# Patient Record
Sex: Female | Born: 1950 | Race: White | Hispanic: No | Marital: Married | Smoking: Former smoker
Health system: Southern US, Community
[De-identification: ages and names within clinical notes are randomized; demographics above are authoritative.]

## PROBLEM LIST (undated history)

## (undated) DIAGNOSIS — E079 Disorder of thyroid, unspecified: Secondary | ICD-10-CM

## (undated) DIAGNOSIS — C801 Malignant (primary) neoplasm, unspecified: Secondary | ICD-10-CM

## (undated) DIAGNOSIS — J449 Chronic obstructive pulmonary disease, unspecified: Secondary | ICD-10-CM

## (undated) DIAGNOSIS — R569 Unspecified convulsions: Secondary | ICD-10-CM

## (undated) DIAGNOSIS — M359 Systemic involvement of connective tissue, unspecified: Secondary | ICD-10-CM

## (undated) HISTORY — PX: APPENDECTOMY: SHX54

---

## 2016-02-13 ENCOUNTER — Emergency Department (HOSPITAL_BASED_OUTPATIENT_CLINIC_OR_DEPARTMENT_OTHER): Payer: BLUE CROSS/BLUE SHIELD

## 2016-02-13 ENCOUNTER — Encounter (HOSPITAL_BASED_OUTPATIENT_CLINIC_OR_DEPARTMENT_OTHER): Payer: Self-pay | Admitting: Emergency Medicine

## 2016-02-13 ENCOUNTER — Emergency Department (HOSPITAL_BASED_OUTPATIENT_CLINIC_OR_DEPARTMENT_OTHER)
Admission: EM | Admit: 2016-02-13 | Discharge: 2016-02-14 | Disposition: A | Discharge status: Home / Self Care | Payer: BLUE CROSS/BLUE SHIELD | Attending: Emergency Medicine | Admitting: Emergency Medicine

## 2016-02-13 DIAGNOSIS — R4182 Altered mental status, unspecified: Secondary | ICD-10-CM | POA: Insufficient documentation

## 2016-02-13 DIAGNOSIS — R531 Weakness: Secondary | ICD-10-CM | POA: Insufficient documentation

## 2016-02-13 DIAGNOSIS — Z87891 Personal history of nicotine dependence: Secondary | ICD-10-CM | POA: Diagnosis not present

## 2016-02-13 DIAGNOSIS — Z79899 Other long term (current) drug therapy: Secondary | ICD-10-CM | POA: Diagnosis not present

## 2016-02-13 DIAGNOSIS — J449 Chronic obstructive pulmonary disease, unspecified: Secondary | ICD-10-CM | POA: Insufficient documentation

## 2016-02-13 DIAGNOSIS — Z87898 Personal history of other specified conditions: Secondary | ICD-10-CM

## 2016-02-13 HISTORY — DX: Unspecified convulsions: R56.9

## 2016-02-13 HISTORY — DX: Malignant (primary) neoplasm, unspecified: C80.1

## 2016-02-13 HISTORY — DX: Chronic obstructive pulmonary disease, unspecified: J44.9

## 2016-02-13 HISTORY — DX: Disorder of thyroid, unspecified: E07.9

## 2016-02-13 HISTORY — DX: Systemic involvement of connective tissue, unspecified: M35.9

## 2016-02-13 LAB — CBC WITH DIFFERENTIAL/PLATELET
Basophils Absolute: 0 10*3/uL (ref 0.0–0.1)
Basophils Relative: 0 %
Eosinophils Absolute: 0.2 10*3/uL (ref 0.0–0.7)
Eosinophils Relative: 2 %
HEMATOCRIT: 40.7 % (ref 36.0–46.0)
Hemoglobin: 13.2 g/dL (ref 12.0–15.0)
LYMPHS ABS: 1.5 10*3/uL (ref 0.7–4.0)
LYMPHS PCT: 20 %
MCH: 28.1 pg (ref 26.0–34.0)
MCHC: 32.4 g/dL (ref 30.0–36.0)
MCV: 86.8 fL (ref 78.0–100.0)
MONO ABS: 0.6 10*3/uL (ref 0.1–1.0)
MONOS PCT: 8 %
NEUTROS ABS: 5.4 10*3/uL (ref 1.7–7.7)
Neutrophils Relative %: 70 %
Platelets: 280 10*3/uL (ref 150–400)
RBC: 4.69 MIL/uL (ref 3.87–5.11)
RDW: 14.1 % (ref 11.5–15.5)
WBC: 7.8 10*3/uL (ref 4.0–10.5)

## 2016-02-13 LAB — URINALYSIS, ROUTINE W REFLEX MICROSCOPIC
Bilirubin Urine: NEGATIVE
Glucose, UA: NEGATIVE mg/dL
Hgb urine dipstick: NEGATIVE
Ketones, ur: NEGATIVE mg/dL
Nitrite: NEGATIVE
Protein, ur: NEGATIVE mg/dL
Specific Gravity, Urine: 1.017 (ref 1.005–1.030)
pH: 7.5 (ref 5.0–8.0)

## 2016-02-13 LAB — COMPREHENSIVE METABOLIC PANEL
ALT: 19 U/L (ref 14–54)
ANION GAP: 6 (ref 5–15)
AST: 20 U/L (ref 15–41)
Albumin: 4 g/dL (ref 3.5–5.0)
Alkaline Phosphatase: 116 U/L (ref 38–126)
BILIRUBIN TOTAL: 0.3 mg/dL (ref 0.3–1.2)
BUN: 14 mg/dL (ref 6–20)
CALCIUM: 9.4 mg/dL (ref 8.9–10.3)
CO2: 28 mmol/L (ref 22–32)
Chloride: 107 mmol/L (ref 101–111)
Creatinine, Ser: 0.59 mg/dL (ref 0.44–1.00)
GFR calc Af Amer: >60 mL/min (ref >60)
Glucose, Bld: 97 mg/dL (ref 65–99)
POTASSIUM: 4.1 mmol/L (ref 3.5–5.1)
Sodium: 141 mmol/L (ref 135–145)
TOTAL PROTEIN: 6.8 g/dL (ref 6.5–8.1)

## 2016-02-13 LAB — URINE MICROSCOPIC-ADD ON: RBC / HPF: NONE SEEN RBC/hpf (ref 0–5)

## 2016-02-13 LAB — TROPONIN I: Troponin I: <0.03 ng/mL (ref <0.03)

## 2016-02-13 LAB — I-STAT CG4 LACTIC ACID, ED: Lactic Acid, Venous: 1.88 mmol/L (ref 0.5–1.9)

## 2016-02-13 LAB — CBG MONITORING, ED: Glucose-Capillary: 89 mg/dL (ref 65–99)

## 2016-02-13 MED ORDER — HYDROCODONE-ACETAMINOPHEN 5-325 MG PO TABS: 1.0000 | ORAL_TABLET | ORAL | Status: AC | PRN

## 2016-02-13 NOTE — ED Notes (Signed)
Patient states that she doesn't feel good reports " I am weak all over and my brain feels weird" - Patient is looking very tired, and is very sluggish with her speech. Patient has equal bilateral weakness. Patient at times takes deep breaths with an almost snoring quality to them. Per husband the patent is unable to walk and she is just stumbling. This started about 0200 - symptoms seems to resolved this am then took a nap starting at 2 pm and then woke up at 5 pm with this weakness. Husband reports that she has had these spells before but they are unsure what they are

## 2016-02-13 NOTE — ED Provider Notes (Signed)
CSN: [id number]     Arrival date & time 02/13/16  1726 History  By signing my name below, I, Julie Welch, attest that this documentation has been prepared under the direction and in the presence of Julie Quale, MD. Electronically Signed: [name], ED Scribe. 02/13/2016. 6:06 PM.    Chief Complaint  Patient presents with  . Weakness  . Altered Mental Status   The history is provided by the patient and the spouse. No language interpreter was used.   HPI Comments: Julie Welch is a 65 y.o. female who presents to the Emergency Department complaining of generalized weakness that began 1 night prior. Pt states that last night she got up to use the restroom and was not able to walk, she states that her feet were moving but she was not going any where. She states that her symptoms resolved from last night but took a nap at 2 PM and when she woke up around 5 PM her symptoms returned. Husband states that she has associated slurred speech. Pt states "my brain feels weird." Pt denies numbness.  Reports similar symptoms 1 year prior. Diagnosed with possible seizure.  On chart review thought to possibly have been a seizure disorder vs conversion episode.  Symptoms resolved and improved on their own.  Patient has been seen by neurology at [facility] as well as locally in her home town.  Patient is not on an antiepileptics.  Patient reports there was lots of stress in her life around the time of the last episode.    Past Medical History:  Diagnosis Date  . Autoimmune disease ([place])   . Cancer ([place])   . COPD (chronic obstructive pulmonary disease) ([place])   . Seizures ([place])    questionable   . Thyroid disease    Past Surgical History:  Procedure Laterality Date  . APPENDECTOMY     History reviewed. No pertinent family history. Social History  Substance Use Topics  . Smoking status: Former Research scientist (life sciences)  . Smokeless tobacco: Not on file  . Alcohol use No   OB History    No data available      Review of Systems  Constitutional: Negative for fever.  HENT: Negative for voice change.   Eyes: Negative for visual disturbance.  Respiratory: Negative for choking, shortness of breath and wheezing.   Cardiovascular: Negative for chest pain and palpitations.  Gastrointestinal: Negative for abdominal pain, diarrhea, nausea and vomiting.  Genitourinary: Negative for dysuria and urgency.  Musculoskeletal: Negative for back pain and myalgias.  Skin: Negative for rash.  Neurological: Positive for speech difficulty and weakness. Negative for numbness.  Hematological: Does not bruise/bleed easily.      Allergies  Levaquin [levofloxacin in d5w]  Home Medications   Prior to Admission medications   Medication Sig Start Date End Date Taking? Authorizing Provider  buPROPion (WELLBUTRIN) 75 MG tablet Take 75 mg by mouth 2 (two) times daily.   Yes Historical Provider, MD  levothyroxine (SYNTHROID, LEVOTHROID) 75 MCG tablet Take 75 mcg by mouth daily before breakfast.   Yes Historical Provider, MD  HYDROcodone-acetaminophen (NORCO/VICODIN) 5-325 MG tablet Take 1 tablet by mouth every 4 (four) hours as needed for moderate pain or severe pain. 02/13/16   Julie Quale, MD   BP 128/74 (BP Location: Right Arm)   Pulse 65   Temp 98.7 F (37.1 C) (Oral)   Resp 19   Ht 5\' 5"  (1.651 m)   Wt 160 lb (72.6 kg)   SpO2 100%  BMI 26.63 kg/m    Physical Exam  Constitutional: She is oriented to person, place, and time. She appears well-developed and well-nourished. No distress.  HENT:  Head: Normocephalic and atraumatic.  Right Ear: External ear normal.  Left Ear: External ear normal.  Nose: Nose normal.  Mouth/Throat: Oropharynx is clear and moist. No oropharyngeal exudate.  Eyes: Conjunctivae and EOM are normal. Pupils are equal, round, and reactive to light.  Neck: Normal range of motion. Neck supple. No tracheal deviation present.  Cardiovascular: Normal rate, regular rhythm, normal  heart sounds and intact distal pulses.   No murmur heard. Pulmonary/Chest: Effort normal and breath sounds normal. No respiratory distress. She has no wheezes. She has no rales.  Abdominal: Soft. She exhibits no distension. There is no tenderness.  Musculoskeletal: Normal range of motion. She exhibits no edema or tenderness.  Neurological: She is alert and oriented to person, place, and time. She has normal strength. No cranial nerve deficit or sensory deficit. She exhibits normal muscle tone.  Reports generalized weakness although appears to have normal strength in all extremities.  Patient able to hold arms up but both shake.  Normal speech.  Normal coordination with normal finger to nose examination.  Skin: Skin is warm and dry. No rash noted. She is not diaphoretic.  Psychiatric: She has a normal mood and affect. Her behavior is normal.  Nursing note and vitals reviewed.   ED Course  Procedures (including critical care time) DIAGNOSTIC STUDIES: Oxygen Saturation is 95% on RA, normal by my interpretation.    COORDINATION OF CARE: 6:21 PM-Discussed treatment plan which includes EKG with pt at bedside and pt agreed to plan.     Labs Review Labs Reviewed  URINALYSIS, ROUTINE W REFLEX MICROSCOPIC (NOT AT [facility]) - Abnormal; Notable for the following:       Result Value   APPearance CLOUDY (*)    Leukocytes, UA SMALL (*)    All other components within normal limits  URINE MICROSCOPIC-ADD ON - Abnormal; Notable for the following:    Squamous Epithelial / LPF 0-5 (*)    Bacteria, UA FEW (*)    All other components within normal limits  CBC WITH DIFFERENTIAL/PLATELET  COMPREHENSIVE METABOLIC PANEL  TROPONIN I  CBG MONITORING, ED  I-STAT CG4 LACTIC ACID, ED    Imaging Review No results found.    EKG Interpretation  Date/Time:  Saturday February 13 2016 18:04:06 EDT Ventricular Rate:  80 PR Interval:    QRS Duration: 110 QT Interval:  399 QTC Calculation: 461 R Axis:   -7 Text  Interpretation:  Sinus rhythm Low voltage, precordial leads Abnormal R-wave progression, early transition Borderline T abnormalities, anterior leads No previous ECGs available Confirmed by Kenton Fortin ([id number]) on 02/13/2016 7:29:24 PM Also confirmed by Lonia Skinner ([id number]), editor [place] CT, Leda Gauze [phone])  on 02/14/2016 9:22:47 AM       MDM  Patient was seen and evaluated in stable condition.  Patient without distributive neuro process with generalized complaint of weakness although denies falling when she had initial episode of not being able to walk.  Symptoms resolved on their own without intervention.  PAtient has already had a similar episode one year ago with extensive work up and second outpatient appointment from neurology at [facility].  Imaging and laboratory studies here unremarkable.  Neurology was consulted to discuss case but were tied up with code stroke and while awaiting call back patient and husband anxious for discharge and did not want to wait for call  back from neurology.  Patient was discharged home in stable condition with strict return precautions and instruction to follow up outpatient. Final diagnoses:  Generalized weakness  History of seizure      I personally performed the services described in this documentation, which was scribed in my presence. The recorded information has been reviewed and is accurate.     Julie Quale, MD 02/19/16 1025

## 2016-02-13 NOTE — ED Notes (Signed)
Pt stated she wanted to see if she could walk after providing a urine sample. EMT helped pt walk down the hall and back. Pt stated she did not feel SOB, or nauseous. Pt stated she did feel "a bit dizzy." Pt did well walking otherwise.

## 2016-02-14 NOTE — Discharge Instructions (Signed)
You were seen and evaluated today for your generalized weakness and inability to speak. The exact cause is not completely clear but this may be related to a neurologic issue such as seizures like you're evaluated for previously. Please make arrangements to follow-up with your primary care physician as soon as possible as well as to follow-up with your neurologist in your hometown as soon as possible. You can also arrange follow-up at [facility] at the number that was provided up above. Seek immediate medical attention if you suddenly developed weakness on one side, difficulty with speech, headache. Use assistive devices to walk as needed.

## 2017-01-12 IMAGING — CT CT HEAD W/O CM
3 series · 16 of 47 positions shown, 19 images · non-contrast
Comparison: None.

CLINICAL DATA: Patient with weakness.  Fatigue.

EXAM:
CT HEAD WITHOUT CONTRAST
TECHNIQUE: Contiguous axial images were obtained from the base of the skull
through the vertex without intravenous contrast.

[Series 2: head wo · axial · 0.42mm/px · z∈[-186,-46]mm · 10 of 34 slices shown, 13 images]
[im 3/34  brain]
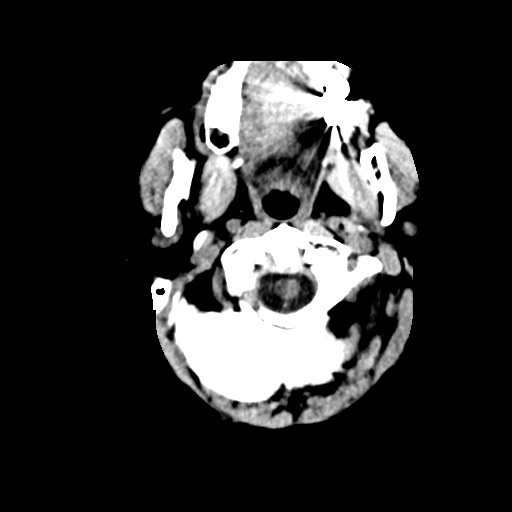
[im 3/34  bone]
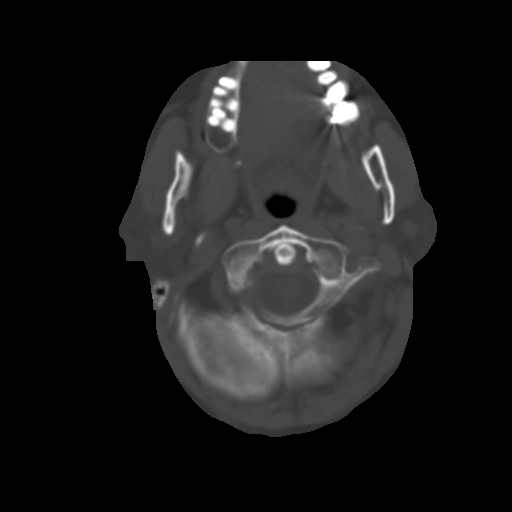
[im 6/34  brain]
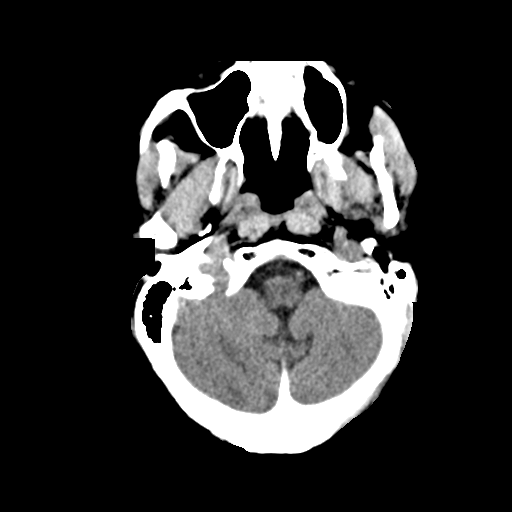
[im 10/34  brain]
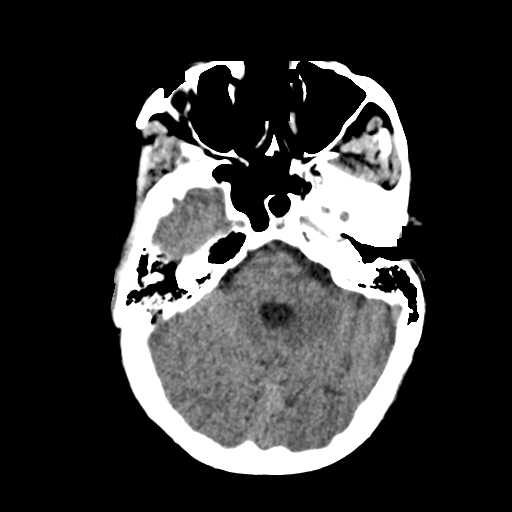
[im 12/34  brain]
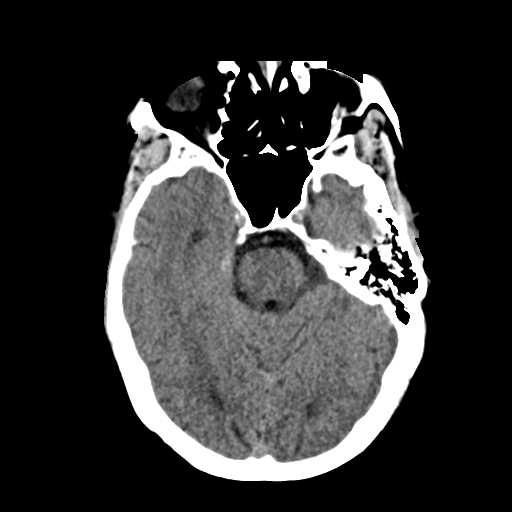
[im 15/34  brain]
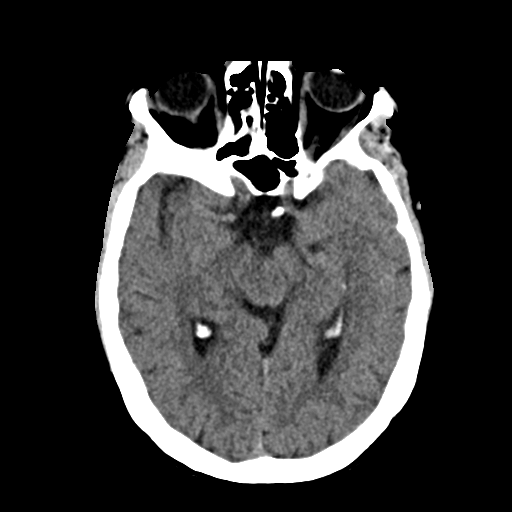
[im 15/34  bone]
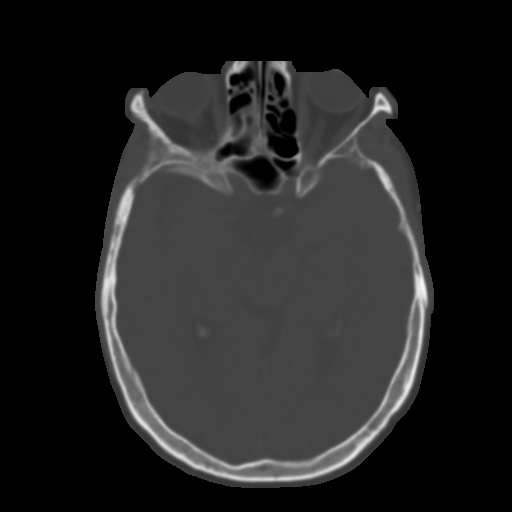
[im 19/34  brain]
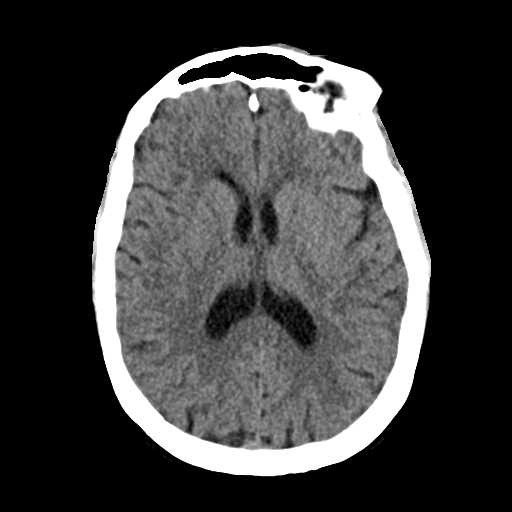
[im 22/34  brain]
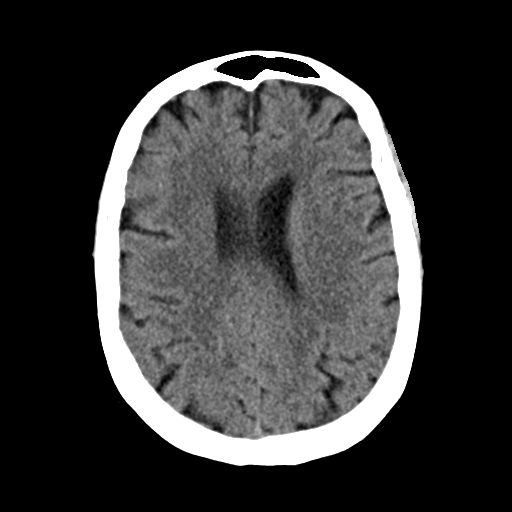
[im 26/34  brain]
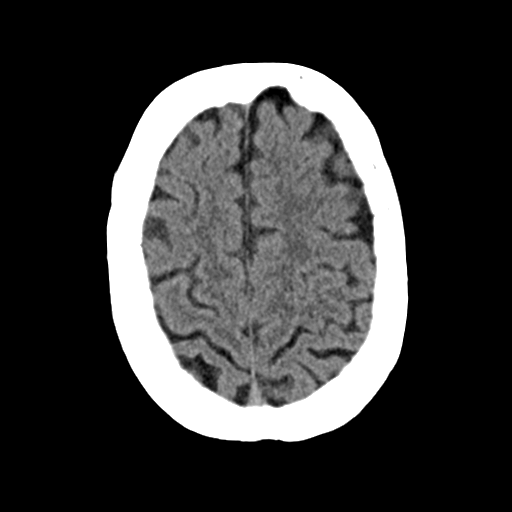
[im 28/34  brain]
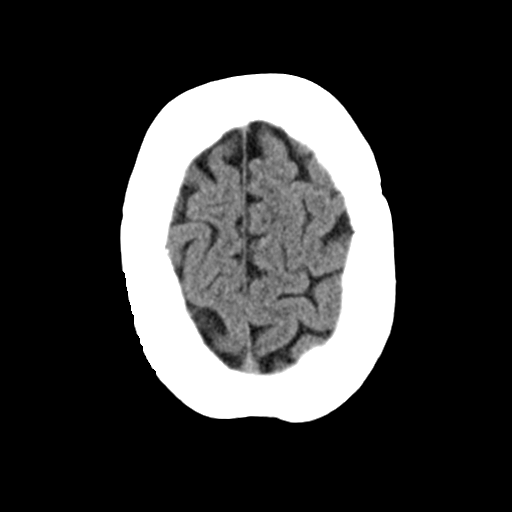
[im 28/34  bone]
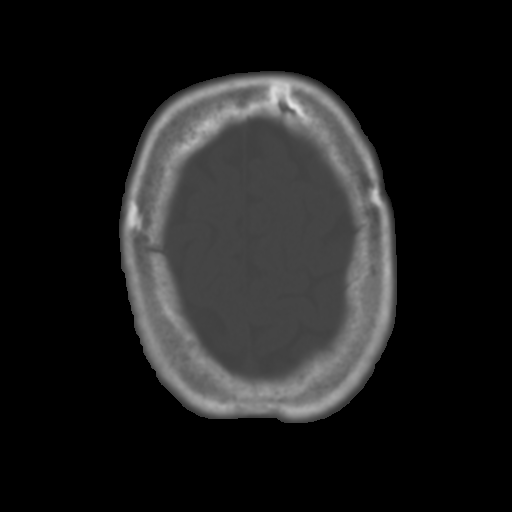
[im 31/34  brain]
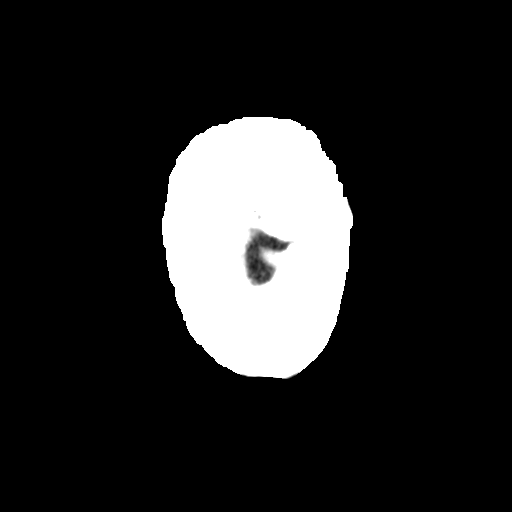

[Series 4: coronal soft · coronal · 0.41mm/px · 3 of 66 slices shown]
[im 22/66  brain]
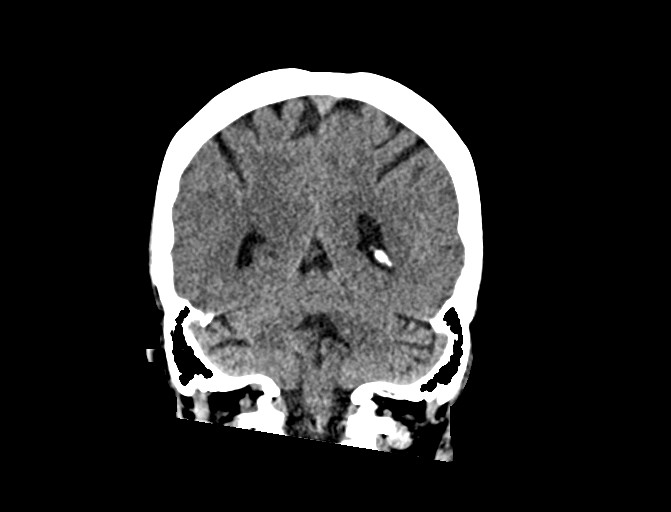
[im 29/66  brain]
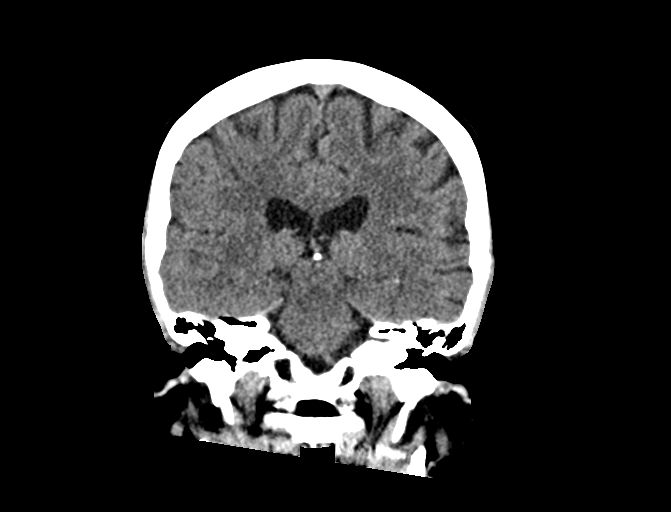
[im 37/66  brain]
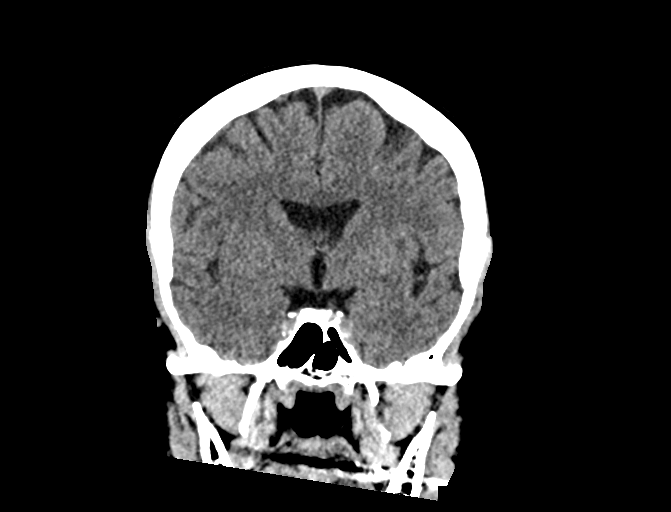

[Series 5: sag soft · sagittal · 0.41mm/px · 3 of 51 slices shown]
[im 18/51  brain]
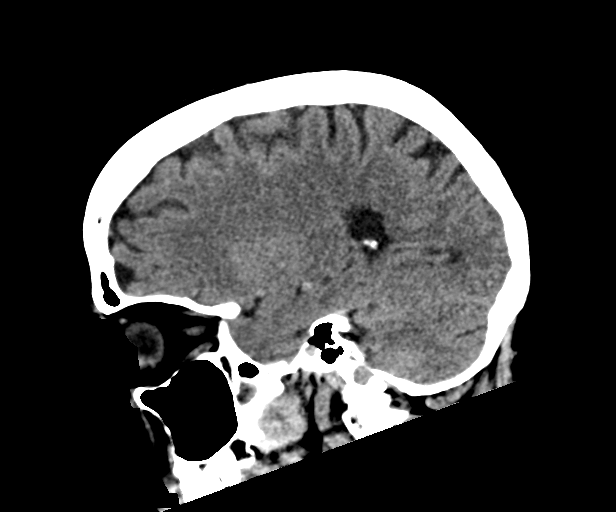
[im 26/51  brain]
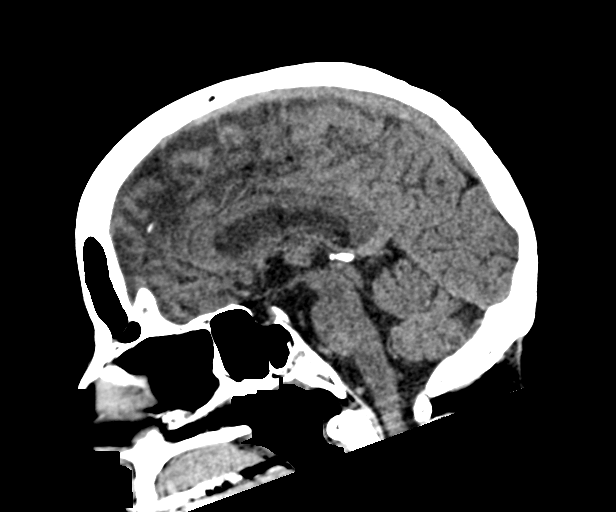
[im 33/51  brain]
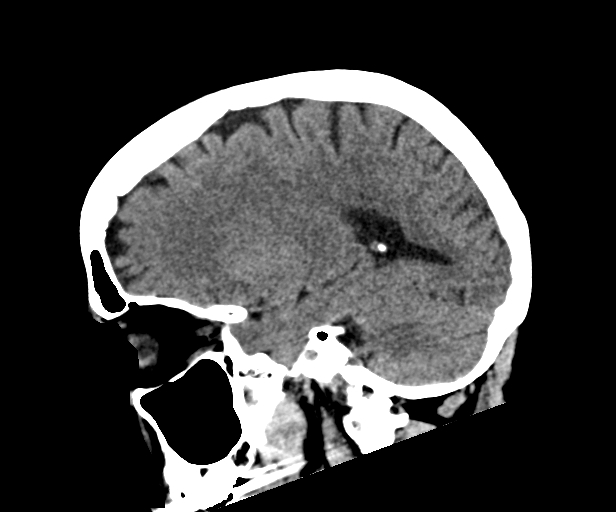

[16 of 47 positions shown; findings below may reference images not displayed]

FINDINGS: Ventricles and sulci are appropriate for patient's age. No evidence
for acute cortically based infarct, intracranial hemorrhage, mass
lesion or mass-effect. Orbits are unremarkable. Paranasal sinuses
are well aerated. Mastoid air cells are unremarkable. Calvarium is
intact.
IMPRESSION: No acute intracranial process.

## 2017-01-12 IMAGING — DX DG CHEST 2V
2 series · 2 of 2 positions shown · non-contrast
Comparison: None.

CLINICAL DATA: Patient with weakness and fatigue.

EXAM:
CHEST  2 VIEW

[chest lat]
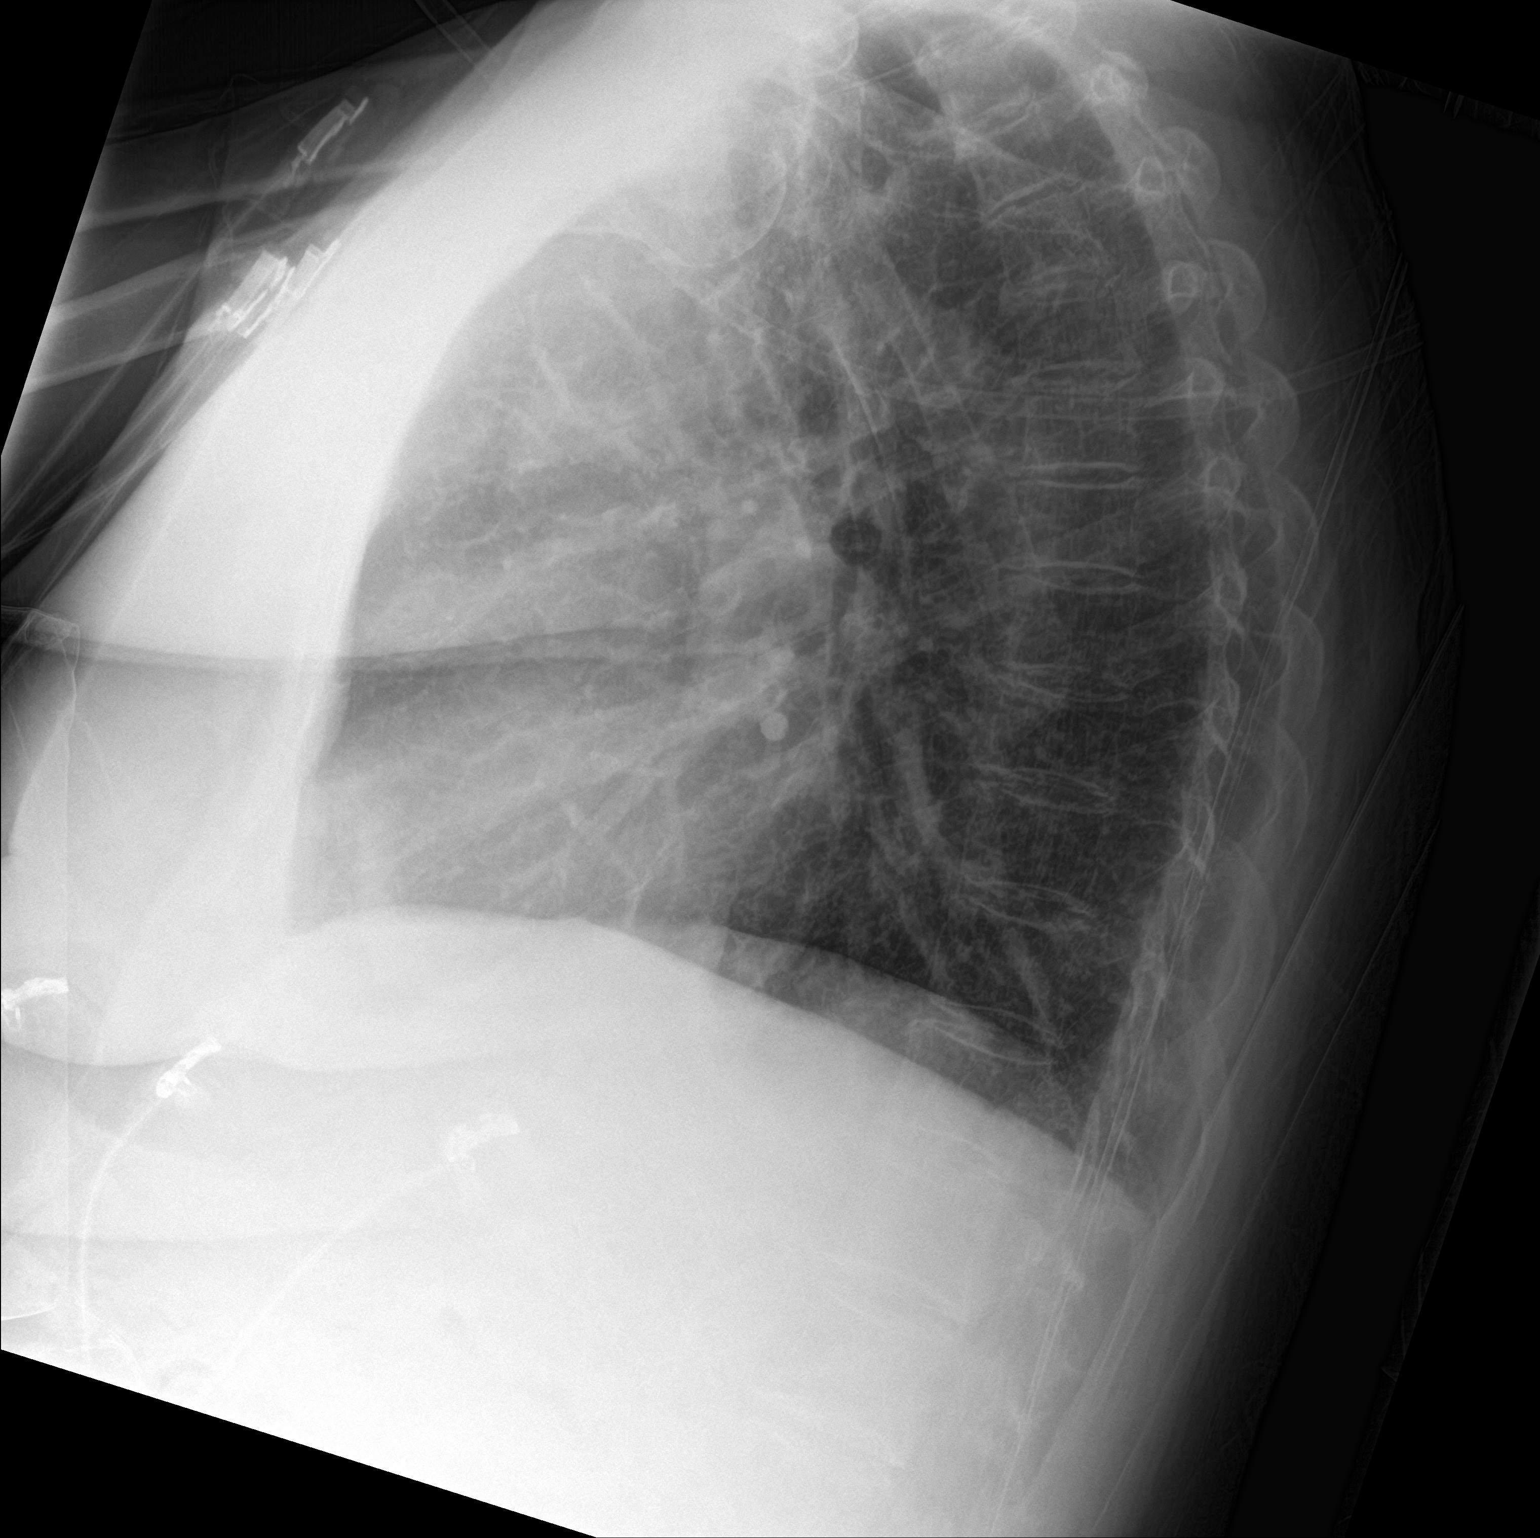

[chest ap]
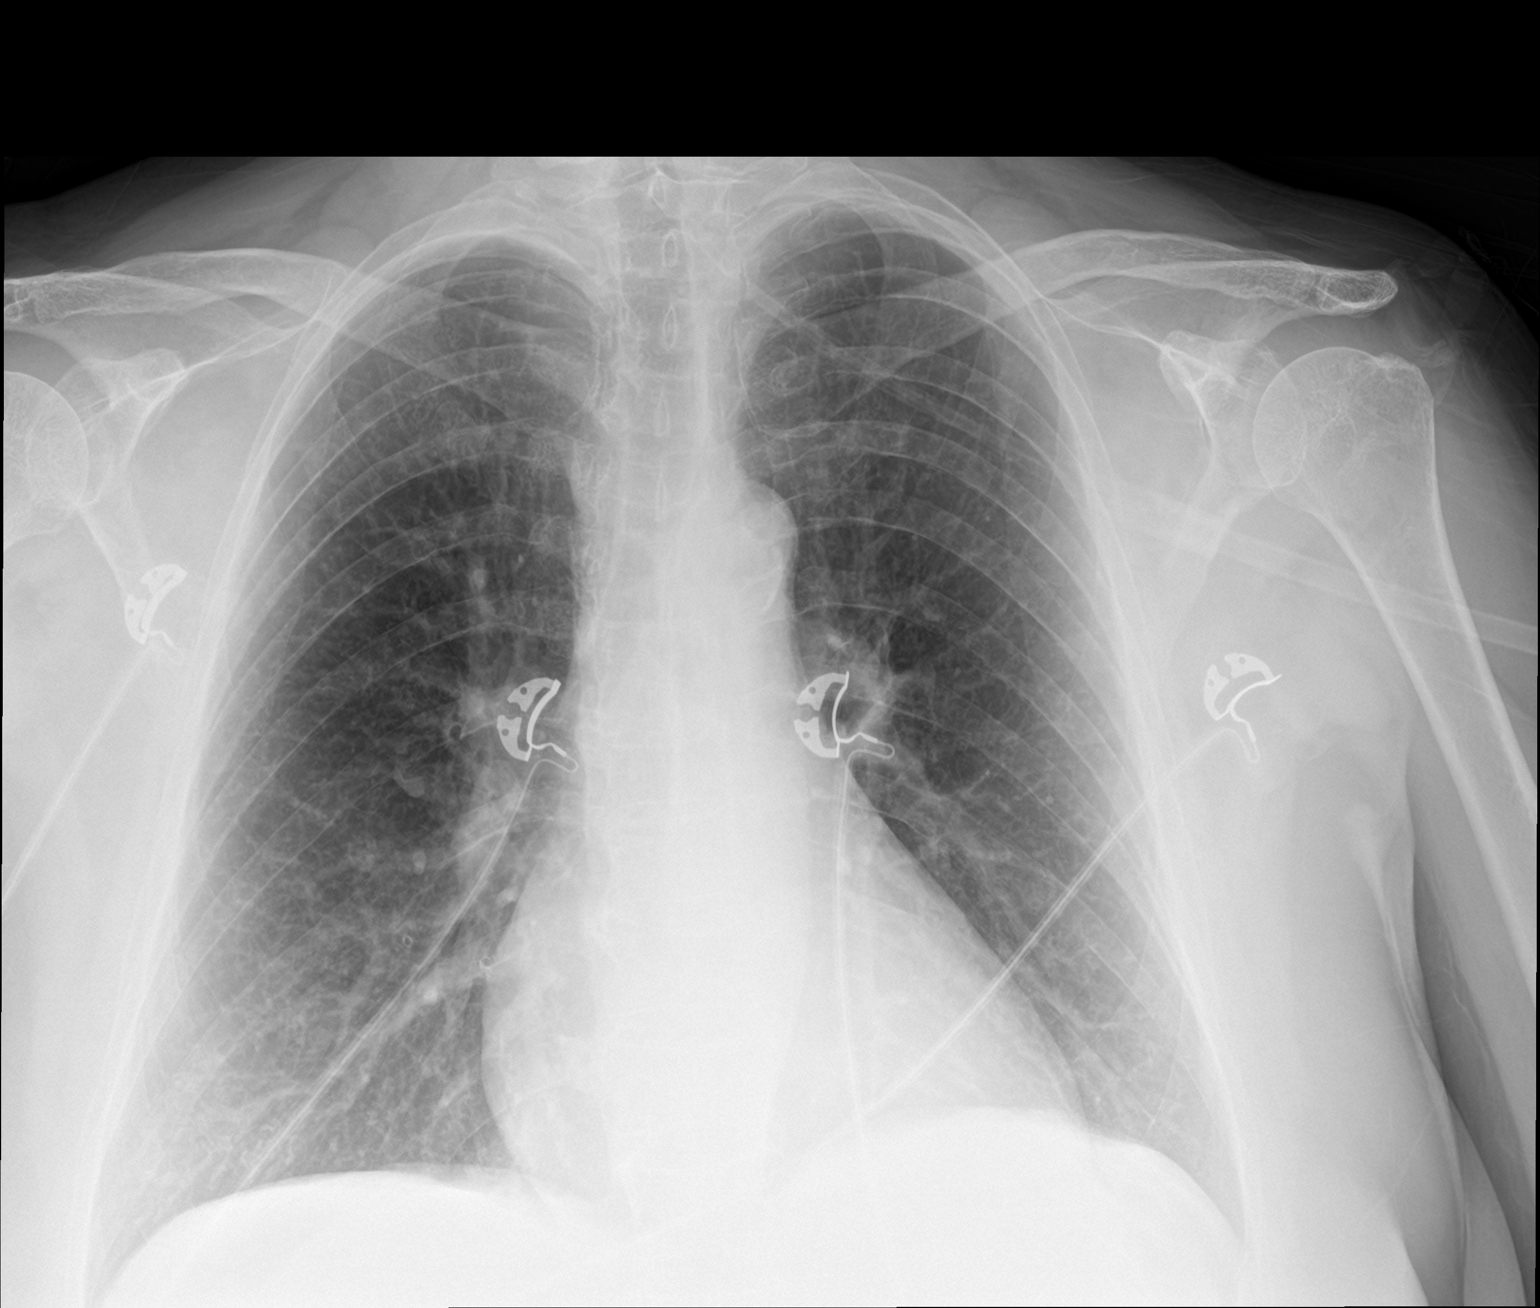

[2 of 2 positions shown; findings below may reference images not displayed]

FINDINGS: Monitoring leads overlie the patient. Cardiac contours upper limits
of normal. No large area of pulmonary consolidation. No pleural
effusion or pneumothorax. Mid thoracic spine degenerative changes.
IMPRESSION: No acute cardiopulmonary process.

Aortic atherosclerosis.

Possible mild cardiomegaly.
